# Patient Record
Sex: Female | Born: 2005 | Race: White | Hispanic: No | Marital: Single
Health system: Southern US, Community
[De-identification: ages and names within clinical notes are randomized; demographics above are authoritative.]

## PROBLEM LIST (undated history)

## (undated) ENCOUNTER — Inpatient Hospital Stay (HOSPITAL_COMMUNITY): Payer: Self-pay

## (undated) DIAGNOSIS — J45909 Unspecified asthma, uncomplicated: Secondary | ICD-10-CM

## (undated) HISTORY — PX: NO PAST SURGERIES: SHX2092

---
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
## 2023-07-03 ENCOUNTER — Other Ambulatory Visit: Payer: Self-pay

## 2023-07-03 ENCOUNTER — Emergency Department (HOSPITAL_COMMUNITY)
Admission: EM | Admit: 2023-07-03 | Discharge: 2023-07-03 | Disposition: A | Discharge status: Home / Self Care | Payer: MEDICAID | Attending: Emergency Medicine | Admitting: Emergency Medicine

## 2023-07-03 ENCOUNTER — Encounter (HOSPITAL_COMMUNITY): Payer: Self-pay | Admitting: Emergency Medicine

## 2023-07-03 DIAGNOSIS — R531 Weakness: Secondary | ICD-10-CM | POA: Diagnosis not present

## 2023-07-03 DIAGNOSIS — R519 Headache, unspecified: Secondary | ICD-10-CM | POA: Diagnosis not present

## 2023-07-03 DIAGNOSIS — Z3A00 Weeks of gestation of pregnancy not specified: Secondary | ICD-10-CM | POA: Diagnosis not present

## 2023-07-03 DIAGNOSIS — R079 Chest pain, unspecified: Secondary | ICD-10-CM | POA: Insufficient documentation

## 2023-07-03 DIAGNOSIS — O26899 Other specified pregnancy related conditions, unspecified trimester: Secondary | ICD-10-CM | POA: Insufficient documentation

## 2023-07-03 LAB — COMPREHENSIVE METABOLIC PANEL
ALT: 22 U/L (ref 0–44)
AST: 21 U/L (ref 15–41)
Albumin: 3.7 g/dL (ref 3.5–5.0)
Alkaline Phosphatase: 64 U/L (ref 38–126)
Anion gap: 9 (ref 5–15)
BUN: 8 mg/dL (ref 6–20)
CO2: 24 mmol/L (ref 22–32)
Calcium: 9.2 mg/dL (ref 8.9–10.3)
Chloride: 105 mmol/L (ref 98–111)
Creatinine, Ser: 0.73 mg/dL (ref 0.44–1.00)
GFR, Estimated: >60 mL/min (ref >60)
Glucose, Bld: 92 mg/dL (ref 70–99)
Potassium: 4.3 mmol/L (ref 3.5–5.1)
Sodium: 138 mmol/L (ref 135–145)
Total Bilirubin: 0.9 mg/dL (ref <1.2)
Total Protein: 6.9 g/dL (ref 6.5–8.1)

## 2023-07-03 LAB — CBC WITH DIFFERENTIAL/PLATELET
Abs Immature Granulocytes: 0.02 10*3/uL (ref 0.00–0.07)
Basophils Absolute: 0 10*3/uL (ref 0.0–0.1)
Basophils Relative: 1 %
Eosinophils Absolute: 0.1 10*3/uL (ref 0.0–0.5)
Eosinophils Relative: 1 %
HCT: 36.8 % (ref 36.0–46.0)
Hemoglobin: 11.4 g/dL — ABNORMAL LOW (ref 12.0–15.0)
Immature Granulocytes: 0 %
Lymphocytes Relative: 26 %
Lymphs Abs: 2 10*3/uL (ref 0.7–4.0)
MCH: 24.9 pg — ABNORMAL LOW (ref 26.0–34.0)
MCHC: 31 g/dL (ref 30.0–36.0)
MCV: 80.5 fL (ref 80.0–100.0)
Monocytes Absolute: 0.5 10*3/uL (ref 0.1–1.0)
Monocytes Relative: 6 %
Neutro Abs: 5 10*3/uL (ref 1.7–7.7)
Neutrophils Relative %: 66 %
Platelets: 285 10*3/uL (ref 150–400)
RBC: 4.57 MIL/uL (ref 3.87–5.11)
RDW: 14.4 % (ref 11.5–15.5)
WBC: 7.7 10*3/uL (ref 4.0–10.5)
nRBC: 0 % (ref 0.0–0.2)

## 2023-07-03 LAB — HCG, QUANTITATIVE, PREGNANCY: hCG, Beta Chain, Quant, S: 105119 m[IU]/mL — ABNORMAL HIGH (ref <5)

## 2023-07-03 LAB — POC URINE PREG, ED: Preg Test, Ur: POSITIVE — AB

## 2023-07-03 NOTE — ED Provider Notes (Signed)
[place] EMERGENCY DEPARTMENT AT [facility] Provider Note   CSN: [id number] Arrival date & time: 07/03/23  1657     History  Chief Complaint  Patient presents with   Headache    Christina Rose is a 17 y.o. female.  17 yo F with a cc of suddenly feeling unwell having a headache and chest pain.  Started while walking around the mall with her fionce.  She feels much better on arrival.  Newly pregnant.     Headache      Home Medications Prior to Admission medications   Medication Sig Start Date End Date Taking? Authorizing Provider  BENZACLIN WITH PUMP gel Apply topically at bedtime. Patient not taking: Reported on 01/26/2018 12/15/16   Clint Guy, MD  medroxyPROGESTERone (DEPO-PROVERA) 150 MG/ML injection Inject 1 mL (150 mg total) into the muscle every 3 (three) months. 01/26/18   McDonell, Alfredia Client, MD  Spinosad 0.9 % SUSP Apply 1 application topically once a week. 04/15/19   Richrd Sox, MD  triamcinolone ointment (KENALOG) 0.5 % Apply 1 application topically 2 (two) times daily as needed. For itching. Do not use for more than 1 week at a time. Patient not taking: Reported on 01/26/2018 12/15/16   Clint Guy, MD      Allergies    Patient has no known allergies.    Review of Systems   Review of Systems  Neurological:  Positive for headaches.    Physical Exam Updated Vital Signs BP (!) 126/91   Pulse 78   Temp 98 F (36.7 C) (Oral)   Resp 20   Ht 4\' 11"  (1.499 m)   Wt 66.2 kg   SpO2 100%   Breastfeeding No   BMI 29.49 kg/m  Physical Exam Vitals and nursing note reviewed.  Constitutional:      General: She is not in acute distress.    Appearance: She is well-developed. She is not diaphoretic.  HENT:     Head: Normocephalic and atraumatic.  Eyes:     Pupils: Pupils are equal, round, and reactive to light.  Cardiovascular:     Rate and Rhythm: Normal rate and regular rhythm.     Heart sounds: No murmur heard.    No friction rub. No  gallop.  Pulmonary:     Effort: Pulmonary effort is normal.     Breath sounds: No wheezing or rales.  Abdominal:     General: There is no distension.     Palpations: Abdomen is soft.     Tenderness: There is no abdominal tenderness.  Musculoskeletal:        General: No tenderness.     Cervical back: Normal range of motion and neck supple.  Skin:    General: Skin is warm and dry.  Neurological:     Mental Status: She is alert and oriented to person, place, and time.  Psychiatric:        Behavior: Behavior normal.     ED Results / Procedures / Treatments   Labs (all labs ordered are listed, but only abnormal results are displayed) Labs Reviewed  CBC WITH DIFFERENTIAL/PLATELET - Abnormal; Notable for the following components:      Result Value   Hemoglobin 11.4 (*)    MCH 24.9 (*)    All other components within normal limits  HCG, QUANTITATIVE, PREGNANCY - Abnormal; Notable for the following components:   hCG, Beta Chain, Quant, S 105,119 (*)    All other components within normal  limits  POC URINE PREG, ED - Abnormal; Notable for the following components:   Preg Test, Ur POSITIVE (*)    All other components within normal limits  COMPREHENSIVE METABOLIC PANEL  CBG MONITORING, ED    EKG EKG Interpretation Date/Time:  Friday July 03 2023 17:43:49 EST Ventricular Rate:  75 PR Interval:  146 QRS Duration:  90 QT Interval:  372 QTC Calculation: 415 R Axis:   56  Text Interpretation: Normal sinus rhythm with sinus arrhythmia Normal ECG no wpw, prolonged qt or brugada No old tracing to compare Confirmed by Melene Plan [phone]) on 07/03/2023 5:50:49 PM  Radiology No results found.  Procedures Procedures    Medications Ordered in ED Medications - No data to display  ED Course/ Medical Decision Making/ A&P                                 Medical Decision Making Amount and/or Complexity of Data Reviewed Labs: ordered. ECG/medicine tests: ordered.   17 yo F  with a cc of chest pain and headache that occurred while walking.  It sounds like this was very short-lived and she feels much better on arrival.  She has a benign neurologic exam here.  Clear lung sounds for me.  Some basic blood work was obtained, no acute anemia, no significant electrolyte abnormalities.  She is just 3 months postpartum but is newly pregnant as well I think it would be extremely unlikely that she would be having eclampsia.  And also would be atypical for it to be there and then resolved.  Will discharge home.  GYN follow-up.  10:09 PM:  I have discussed the diagnosis/risks/treatment options with the patient.  Evaluation and diagnostic testing in the emergency department does not suggest an emergent condition requiring admission or immediate intervention beyond what has been performed at this time.  They will follow up with PCP. We also discussed returning to the ED immediately if new or worsening sx occur. We discussed the sx which are most concerning (e.g., sudden worsening pain, fever, inability to tolerate by mouth) that necessitate immediate return. Medications administered to the patient during their visit and any new prescriptions provided to the patient are listed below.  Medications given during this visit Medications - No data to display   The patient appears reasonably screen and/or stabilized for discharge and I doubt any other medical condition or other [facility] requiring further screening, evaluation, or treatment in the ED at this time prior to discharge.          Final Clinical Impression(s) / ED Diagnoses Final diagnoses:  Weakness    Rx / DC Orders ED Discharge Orders     None         Melene Plan, DO 07/03/23 2209

## 2023-07-03 NOTE — ED Provider Triage Note (Signed)
Emergency Medicine Provider Triage Evaluation Note  Christina Rose , a 17 y.o. female  was evaluated in triage.  Pt complains of chest pain headache feel like she might pass out.  This occurred while she was walking with her fianc in the mall.  Seems like things if already started to get better.  EMS was called and she was taken here.  She delivered a baby about 3 months ago.  She denies any complications of the pregnancy.  She is currently pregnant again.  She is not sure how far along she is.  Denies any vaginal bleeding pelvic cramping.  Denies nausea or vomiting.  Denies cough congestion or fever..  Review of Systems  Positive: Cp, headache Negative: Pelvic pain, vaginal bleeding  Physical Exam  BP 98/74   Pulse 82   Temp 98.7 F (37.1 C) (Oral)   Resp 16   Ht 4\' 11"  (1.499 m)   Wt 66.2 kg   SpO2 100%   Breastfeeding No   BMI 29.49 kg/m  Gen:   Awake, no distress   Resp:  Normal effort  MSK:   Moves extremities without difficulty  Other:    Medical Decision Making  Medically screening exam initiated at 5:42 PM.  Appropriate orders placed.  Christina Rose was informed that the remainder of the evaluation will be completed by another provider, this initial triage assessment does not replace that evaluation, and the importance of remaining in the ED until their evaluation is complete.  17 year old female with a chief complaints of sudden onset chest pain and headache.  This occurred while she was walking in the mall with her significant other.  She already feels quite a bit better.  Seems unlikely that this is eclampsia or acute intracranial hemorrhage or aortic dissection.  Perhaps she had a vasovagal event.  Will obtain basic blood work here.  Observe in the ED.   Christina Plan, DO 07/03/23 1744

## 2023-07-03 NOTE — ED Triage Notes (Addendum)
Pt BIB [facility] from home for a HA.  Pt walked to mall from home.  Pt began feeling lightheaded, nauseous with HA. Pt has had positive home preg test.  Pt confirmed pregnancy with her OB at [facility].  Unsure how far along at this time. Pt is post-pardom 3 months.   136/98 HR 88 100%  CBG 111

## 2023-07-03 NOTE — Discharge Instructions (Addendum)
Follow up with your family doc and GYN.

## 2023-12-06 ENCOUNTER — Encounter (HOSPITAL_COMMUNITY): Payer: Self-pay | Admitting: Family Medicine

## 2023-12-06 ENCOUNTER — Other Ambulatory Visit: Payer: Self-pay

## 2023-12-06 ENCOUNTER — Inpatient Hospital Stay (HOSPITAL_COMMUNITY)
Admission: AD | Admit: 2023-12-06 | Discharge: 2023-12-06 | Disposition: A | Discharge status: Home / Self Care | Payer: MEDICAID | Attending: Family Medicine | Admitting: Family Medicine

## 2023-12-06 DIAGNOSIS — Z0371 Encounter for suspected problem with amniotic cavity and membrane ruled out: Secondary | ICD-10-CM | POA: Diagnosis present

## 2023-12-06 DIAGNOSIS — Z3689 Encounter for other specified antenatal screening: Secondary | ICD-10-CM

## 2023-12-06 DIAGNOSIS — Z3A30 30 weeks gestation of pregnancy: Secondary | ICD-10-CM

## 2023-12-06 HISTORY — DX: Unspecified asthma, uncomplicated: J45.909

## 2023-12-06 LAB — URINALYSIS, ROUTINE W REFLEX MICROSCOPIC
Bilirubin Urine: NEGATIVE
Glucose, UA: NEGATIVE mg/dL
Hgb urine dipstick: NEGATIVE
Ketones, ur: NEGATIVE mg/dL
Leukocytes,Ua: NEGATIVE
Nitrite: NEGATIVE
Protein, ur: 30 mg/dL — AB
Specific Gravity, Urine: 1.024 (ref 1.005–1.030)
pH: 5 (ref 5.0–8.0)

## 2023-12-06 LAB — POCT FERN TEST: POCT Fern Test: NEGATIVE

## 2023-12-06 NOTE — MAU Provider Note (Signed)
 History     CSN: [id number]  Arrival date and time: 12/06/23 2012   Event Date/Time   First Provider Initiated Contact with Patient 12/06/23 2114      Chief Complaint  Patient presents with   Rupture of Membranes   Christina Rose is a 18 y.o. G2P1001 at [redacted]w[redacted]d who receives care at [facility] with next appt June 2.  She presents today, via EMS, for R/O ROM.  She reports she was "in the middle of about to start intercourse and had a gush of clear fluid."  This occurred around 1900.  She denies continued leaking and discharge prior to this incident. Patient endorses fetal movement and denies abdominal cramping or contractions.   OB History     Gravida  2   Para  1   Term  1   Preterm      AB      Living  1      SAB      IAB      Ectopic      Multiple      Live Births  1           Past Medical History:  Diagnosis Date   Asthma     History reviewed. No pertinent surgical history.  Family History  Problem Relation Age of Onset   Gestational diabetes Mother    Bipolar disorder Maternal Grandmother     Social History   Tobacco Use   Smoking status: Passive Smoke Exposure - Never Smoker   Smokeless tobacco: Never  Substance Use Topics   Alcohol use: No   Drug use: No    Allergies: No Known Allergies  Medications Prior to Admission  Medication Sig Dispense Refill Last Dose/Taking   Prenatal MV & Min w/FA-DHA (PRENATAL GUMMIES PO) Take 1 tablet by mouth daily.   12/06/2023   BENZACLIN  WITH PUMP gel Apply topically at bedtime. (Patient not taking: Reported on 01/26/2018) 25 g 11    medroxyPROGESTERone  (DEPO-PROVERA ) 150 MG/ML injection Inject 1 mL (150 mg total) into the muscle every 3 (three) months. 1 mL 3    Spinosad  0.9 % SUSP Apply 1 application topically once a week. 120 mL 1    triamcinolone  ointment (KENALOG ) 0.5 % Apply 1 application topically 2 (two) times daily as needed. For itching. Do not use for more than 1 week at a time.  (Patient not taking: Reported on 01/26/2018) 15 g 1     Review of Systems  Gastrointestinal:  Negative for abdominal pain, constipation, diarrhea, nausea and vomiting.  Genitourinary:  Negative for difficulty urinating, dysuria, vaginal bleeding and vaginal discharge.   Physical Exam   Blood pressure 124/79, pulse (!) 119, temperature 98.1 F (36.7 C), temperature source Oral, resp. rate 18, height 5' (1.524 m), SpO2 99%, not currently breastfeeding.  Physical Exam Vitals reviewed. Exam conducted with a chaperone present Cay Cocking, Char[employer] fundraiser).  Constitutional:      Appearance: Normal appearance.  HENT:     Head: Normocephalic and atraumatic.  Eyes:     Conjunctiva/sclera: Conjunctivae normal.  Cardiovascular:     Rate and Rhythm: Tachycardia present.  Pulmonary:     Effort: Pulmonary effort is normal. No respiratory distress.  Genitourinary:    General: Normal vulva.     Comments: No apparent discharge, fluid, or appearance of moisture. Speculum exam with negative pooling. Minimal discharge. Cervix pink, no lesions or cysts. Appears closed. Negative fluid valsalva.  Musculoskeletal:  General: Normal range of motion.  Skin:    General: Skin is warm and dry.  Neurological:     Mental Status: She is alert and oriented to person, place, and time.  Psychiatric:        Mood and Affect: Mood normal.        Behavior: Behavior normal.     Fetal Assessment 140 bpm, Mod Var, -Decels, +15x15 Accels Toco: Irritability  MAU Course   Results for orders placed or performed during the hospital encounter of 12/06/23 (from the past 24 hours)  Urinalysis, Routine w reflex microscopic -Urine, Clean Catch     Status: Abnormal   Collection Time: 12/06/23  9:27 PM  Result Value Ref Range   Color, Urine AMBER (A) YELLOW   APPearance HAZY (A) CLEAR   Specific Gravity, Urine 1.024 1.005 - 1.030   pH 5.0 5.0 - 8.0   Glucose, UA NEGATIVE NEGATIVE mg/dL   Hgb urine dipstick NEGATIVE NEGATIVE    Bilirubin Urine NEGATIVE NEGATIVE   Ketones, ur NEGATIVE NEGATIVE mg/dL   Protein, ur 30 (A) NEGATIVE mg/dL   Nitrite NEGATIVE NEGATIVE   Leukocytes,Ua NEGATIVE NEGATIVE   RBC / HPF 0-5 0 - 5 RBC/hpf   WBC, UA 6-10 0 - 5 WBC/hpf   Bacteria, UA RARE (A) NONE SEEN   Squamous Epithelial / HPF 0-5 0 - 5 /HPF   Mucus PRESENT   Fern Test     Status: Normal   Collection Time: 12/06/23  9:34 PM  Result Value Ref Range   POCT Fern Test Negative = intact amniotic membranes    No results found.   MDM PE Labs: UA, Fern EFM Assessment and Plan   18 year old G2P1001  SIUP at 30.1weeks Cat I FT R/O pPROM  -POC Reviewed. -Exam performed and findings discussed. -Informed findings not suggestive of ROM. -Confirmed with fern. -Will send urine to r/o infection. -NST reactive.   Kraig Peru MSN, CNM 12/06/2023, 9:14 PM   Reassessment (9:46 PM) -UA returns negative. -Nurse to give results and precautions. -Encouraged to call primary office or return to MAU if symptoms worsen or with the onset of new symptoms. -Discharged to home in stable condition.  Kraig Peru MSN, CNM Advanced Practice Provider, [facility] [employer]

## 2023-12-06 NOTE — MAU Note (Signed)
 Christina Rose is a 18 y.o. at Unknown here in MAU reporting: a warm gush of fluid around 1900 right before getting ready to have intercourse. States that the fluid was clear. Denies ctx. Slight decrease in fetal movement. Denies VB.    Onset of complaint: today at around 1900 Pain score: 0/10 There were no vitals filed for this visit.   FHT: 160  Lab orders placed from triage: urine and fern slide

## 2024-01-28 ENCOUNTER — Inpatient Hospital Stay (HOSPITAL_COMMUNITY)
Admission: AD | Admit: 2024-01-28 | Discharge: 2024-01-28 | Disposition: A | Discharge status: Home / Self Care | Payer: MEDICAID | Attending: Obstetrics & Gynecology | Admitting: Obstetrics & Gynecology

## 2024-01-28 ENCOUNTER — Other Ambulatory Visit: Payer: Self-pay

## 2024-01-28 ENCOUNTER — Encounter (HOSPITAL_COMMUNITY): Payer: Self-pay | Admitting: Obstetrics & Gynecology

## 2024-01-28 DIAGNOSIS — O471 False labor at or after 37 completed weeks of gestation: Secondary | ICD-10-CM | POA: Diagnosis present

## 2024-01-28 DIAGNOSIS — Z3A37 37 weeks gestation of pregnancy: Secondary | ICD-10-CM | POA: Insufficient documentation

## 2024-01-28 DIAGNOSIS — O479 False labor, unspecified: Secondary | ICD-10-CM | POA: Insufficient documentation

## 2024-01-28 NOTE — Discharge Planning (Signed)
 S: Ms. Christina Rose is a 18 y.o. G2P1001 at [redacted]w[redacted]d  who presents to MAU today for labor evaluation.     Cervical exam by RN:  Dilation: 3.5 Effacement (%): 60 Station: -2 Presentation: Vertex Exam by:: Powell Sprague, RN  Fetal Monitoring: Baseline: 150 Variability: moderate Accelerations: present Decelerations: none Contractions: irreg  MDM Discussed patient with RN. NST reviewed.   A: SIUP at [redacted]w[redacted]d  False labor  P: Discharge home Labor precautions and kick counts included in AVS Patient to follow-up with  as scheduled  Patient may return to MAU as needed or when in labor   Terri Lacks, MD 01/28/2024 3:34 AM

## 2024-01-28 NOTE — MAU Note (Signed)
 Patient is a G2P1, [redacted]w[redacted]d that arrived via EMS with complaints of contractions that started approximately an hour ago. Patient rates her pain an 8 on a 0-10 pain scale. Patient denies any LOF or bleeding. +FM reported. VSS. Monitors applied and assessing.

## 2024-08-09 ENCOUNTER — Encounter: Admitting: Obstetrics & Gynecology
